# Patient Record
Sex: Female | Born: 2000 | Race: White | Hispanic: No | Marital: Single | Smoking: Never smoker
Health system: Southern US, Community
[De-identification: ages and names within clinical notes are randomized; demographics above are authoritative.]

## PROBLEM LIST (undated history)

## (undated) DIAGNOSIS — Z789 Other specified health status: Secondary | ICD-10-CM

## (undated) HISTORY — PX: FOOT SURGERY: SHX648

---
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
## 2024-08-22 ENCOUNTER — Encounter (HOSPITAL_COMMUNITY): Payer: Self-pay | Admitting: Obstetrics and Gynecology

## 2024-08-22 ENCOUNTER — Inpatient Hospital Stay (HOSPITAL_COMMUNITY)
Admission: AD | Admit: 2024-08-22 | Discharge: 2024-08-22 | Discharge status: Against Medical Advice | Attending: Obstetrics and Gynecology | Admitting: Obstetrics and Gynecology

## 2024-08-22 DIAGNOSIS — O99322 Drug use complicating pregnancy, second trimester: Secondary | ICD-10-CM | POA: Insufficient documentation

## 2024-08-22 DIAGNOSIS — F419 Anxiety disorder, unspecified: Secondary | ICD-10-CM

## 2024-08-22 DIAGNOSIS — O99280 Endocrine, nutritional and metabolic diseases complicating pregnancy, unspecified trimester: Secondary | ICD-10-CM

## 2024-08-22 DIAGNOSIS — E860 Dehydration: Secondary | ICD-10-CM | POA: Diagnosis not present

## 2024-08-22 DIAGNOSIS — F1290 Cannabis use, unspecified, uncomplicated: Secondary | ICD-10-CM | POA: Insufficient documentation

## 2024-08-22 DIAGNOSIS — O219 Vomiting of pregnancy, unspecified: Secondary | ICD-10-CM | POA: Diagnosis not present

## 2024-08-22 DIAGNOSIS — O99342 Other mental disorders complicating pregnancy, second trimester: Secondary | ICD-10-CM | POA: Insufficient documentation

## 2024-08-22 DIAGNOSIS — O209 Hemorrhage in early pregnancy, unspecified: Secondary | ICD-10-CM | POA: Diagnosis present

## 2024-08-22 DIAGNOSIS — O99282 Endocrine, nutritional and metabolic diseases complicating pregnancy, second trimester: Secondary | ICD-10-CM | POA: Diagnosis not present

## 2024-08-22 DIAGNOSIS — Z3A14 14 weeks gestation of pregnancy: Secondary | ICD-10-CM | POA: Diagnosis not present

## 2024-08-22 HISTORY — DX: Other specified health status: Z78.9

## 2024-08-22 LAB — URINALYSIS, ROUTINE W REFLEX MICROSCOPIC
Bilirubin Urine: NEGATIVE
Glucose, UA: NEGATIVE mg/dL
Ketones, ur: 80 mg/dL — AB
Leukocytes,Ua: NEGATIVE
Nitrite: NEGATIVE
Protein, ur: 100 mg/dL — AB
Specific Gravity, Urine: 1.028 (ref 1.005–1.030)
Squamous Epithelial / HPF: >50 /HPF (ref 0–5)
pH: 5 (ref 5.0–8.0)

## 2024-08-22 LAB — CBC
HCT: 43.4 % (ref 36.0–46.0)
Hemoglobin: 14.9 g/dL (ref 12.0–15.0)
MCH: 29.8 pg (ref 26.0–34.0)
MCHC: 34.3 g/dL (ref 30.0–36.0)
MCV: 86.8 fL (ref 80.0–100.0)
Platelets: 288 K/uL (ref 150–400)
RBC: 5 MIL/uL (ref 3.87–5.11)
RDW: 12.6 % (ref 11.5–15.5)
WBC: 16.8 K/uL — ABNORMAL HIGH (ref 4.0–10.5)
nRBC: 0 % (ref 0.0–0.2)

## 2024-08-22 LAB — COMPREHENSIVE METABOLIC PANEL WITH GFR
ALT: 20 U/L (ref 0–44)
AST: 17 U/L (ref 15–41)
Albumin: 4 g/dL (ref 3.5–5.0)
Alkaline Phosphatase: 46 U/L (ref 38–126)
Anion gap: 13 (ref 5–15)
BUN: 6 mg/dL (ref 6–20)
CO2: 22 mmol/L (ref 22–32)
Calcium: 9.9 mg/dL (ref 8.9–10.3)
Chloride: 104 mmol/L (ref 98–111)
Creatinine, Ser: 0.53 mg/dL (ref 0.44–1.00)
GFR, Estimated: >60 mL/min (ref >60)
Glucose, Bld: 94 mg/dL (ref 70–99)
Potassium: 3.4 mmol/L — ABNORMAL LOW (ref 3.5–5.1)
Sodium: 139 mmol/L (ref 135–145)
Total Bilirubin: 0.6 mg/dL (ref 0.0–1.2)
Total Protein: 7.6 g/dL (ref 6.5–8.1)

## 2024-08-22 LAB — WET PREP, GENITAL
Sperm: NONE SEEN
Trich, Wet Prep: NONE SEEN
WBC, Wet Prep HPF POC: <10 (ref <10)
Yeast Wet Prep HPF POC: NONE SEEN

## 2024-08-22 MED ORDER — LACTATED RINGERS IV BOLUS
1000.0000 mL | Freq: Once | INTRAVENOUS | Status: AC
  Administered 2024-08-22: 1000 mL via INTRAVENOUS

## 2024-08-22 MED ORDER — METOCLOPRAMIDE HCL 5 MG/ML IJ SOLN
10.0000 mg | Freq: Once | INTRAMUSCULAR | Status: AC
  Administered 2024-08-22: 10 mg via INTRAVENOUS
  Filled 2024-08-22: qty 2

## 2024-08-22 MED ORDER — SCOPOLAMINE 1 MG/3DAYS TD PT72
1.0000 | MEDICATED_PATCH | TRANSDERMAL | Status: DC
  Administered 2024-08-22: 1 mg via TRANSDERMAL
  Filled 2024-08-22: qty 1

## 2024-08-22 NOTE — MAU Provider Note (Incomplete)
 Chief Complaint:  Emesis and Vaginal Bleeding   HPI    Rachel Pham is a 23 y.o. G1P0 at Unknown who presents to maternity admissions reporting ***.   Pregnancy Course: ***  Past Medical History:  Diagnosis Date  . Medical history non-contributory    OB History  Gravida Para Term Preterm AB Living  1       SAB IAB Ectopic Multiple Live Births          # Outcome Date GA Lbr Len/2nd Weight Sex Type Anes PTL Lv  1 Current            Past Surgical History:  Procedure Laterality Date  . FOOT SURGERY     glass removal from foot   History reviewed. No pertinent family history. Social History   Tobacco Use  . Smoking status: Never  Substance Use Topics  . Alcohol use: Not Currently  . Drug use: Yes    Types: Marijuana   Allergies  Allergen Reactions  . Penicillins Rash   No medications prior to admission.    I have reviewed patient's Past Medical Hx, Surgical Hx, Family Hx, Social Hx, medications and allergies.   ROS  Pertinent items noted in HPI and remainder of comprehensive ROS otherwise negative.   PHYSICAL EXAM  Patient Vitals for the past 24 hrs:  BP Temp Temp src Pulse Resp Height Weight  08/22/24 2124 133/82 98.2 F (36.8 C) Oral 80 16 5' 4.5 (1.638 m) 86.3 kg    Constitutional: Well-developed, well-nourished female in no acute distress.  Cardiovascular: normal rate & rhythm, warm and well-perfused Respiratory: normal effort, no problems with respiration noted GI: Abd soft, non-tender, gravid MS: Extremities nontender, no edema, normal ROM Neurologic: Alert and oriented x 4.  GU: no CVA tenderness Pelvic: NEFG, physiologic discharge, no blood, cervix clean.      Fetal Tracing: 160 via Doppler   Labs: Results for orders placed or performed during the hospital encounter of 08/22/24 (from the past 24 hours)  Urinalysis, Routine w reflex microscopic -Urine, Clean Catch     Status: Abnormal   Collection Time: 08/22/24  9:46 PM  Result Value Ref  Range   Color, Urine AMBER (A) YELLOW   APPearance CLOUDY (A) CLEAR   Specific Gravity, Urine 1.028 1.005 - 1.030   pH 5.0 5.0 - 8.0   Glucose, UA NEGATIVE NEGATIVE mg/dL   Hgb urine dipstick LARGE (A) NEGATIVE   Bilirubin Urine NEGATIVE NEGATIVE   Ketones, ur 80 (A) NEGATIVE mg/dL   Protein, ur 899 (A) NEGATIVE mg/dL   Nitrite NEGATIVE NEGATIVE   Leukocytes,Ua NEGATIVE NEGATIVE   RBC / HPF 0-5 0 - 5 RBC/hpf   WBC, UA 0-5 0 - 5 WBC/hpf   Bacteria, UA FEW (A) NONE SEEN   Squamous Epithelial / HPF >50 0 - 5 /HPF   Mucus PRESENT   CBC     Status: Abnormal   Collection Time: 08/22/24 11:00 PM  Result Value Ref Range   WBC 16.8 (H) 4.0 - 10.5 K/uL   RBC 5.00 3.87 - 5.11 MIL/uL   Hemoglobin 14.9 12.0 - 15.0 g/dL   HCT 56.5 63.9 - 53.9 %   MCV 86.8 80.0 - 100.0 fL   MCH 29.8 26.0 - 34.0 pg   MCHC 34.3 30.0 - 36.0 g/dL   RDW 87.3 88.4 - 84.4 %   Platelets 288 150 - 400 K/uL   nRBC 0.0 0.0 - 0.2 %  Comprehensive metabolic panel  Status: Abnormal   Collection Time: 08/22/24 11:00 PM  Result Value Ref Range   Sodium 139 135 - 145 mmol/L   Potassium 3.4 (L) 3.5 - 5.1 mmol/L   Chloride 104 98 - 111 mmol/L   CO2 22 22 - 32 mmol/L   Glucose, Bld 94 70 - 99 mg/dL   BUN 6 6 - 20 mg/dL   Creatinine, Ser 9.46 0.44 - 1.00 mg/dL   Calcium 9.9 8.9 - 89.6 mg/dL   Total Protein 7.6 6.5 - 8.1 g/dL   Albumin 4.0 3.5 - 5.0 g/dL   AST 17 15 - 41 U/L   ALT 20 0 - 44 U/L   Alkaline Phosphatase 46 38 - 126 U/L   Total Bilirubin 0.6 0.0 - 1.2 mg/dL   GFR, Estimated >39 >39 mL/min   Anion gap 13 5 - 15  Wet prep, genital     Status: Abnormal   Collection Time: 08/22/24 11:00 PM  Result Value Ref Range   Yeast Wet Prep HPF POC NONE SEEN NONE SEEN   Trich, Wet Prep NONE SEEN NONE SEEN   Clue Cells Wet Prep HPF POC PRESENT (A) NONE SEEN   WBC, Wet Prep HPF POC <10 <10   Sperm NONE SEEN     Imaging:  No results found.  MDM & MAU COURSE  MDM:  HIGH  MAU Course: Orders Placed This  Encounter  Procedures  . Wet prep, genital  . Urinalysis, Routine w reflex microscopic -Urine, Clean Catch  . CBC  . Comprehensive metabolic panel   Meds ordered this encounter  Medications  . lactated ringers bolus 1,000 mL  . metoCLOPramide (REGLAN) injection 10 mg  . scopolamine (TRANSDERM-SCOP) 1 MG/3DAYS 1 mg    ASSESSMENT  No diagnosis found.  PLAN  Informed by RN that patient wanted her IV out and she was leaving AMA prior to results @ 2323 and she signed AMA form prior to ambulating out with significant other. ------------------------------------------------------------------------------- Olam Dalton, MSN, [facility], Center for [facility]    This chart was dictated using voice recognition software, Dragon. Despite the best efforts of this provider to proofread and correct errors, errors may still occur which can change documentation meaning.

## 2024-08-22 NOTE — MAU Note (Addendum)
 Pt says a [facility] in Aug - positive UPT .  Had labs at [facility] in [place]  in Sept . Had U/S- IUP in Sept. Went to [facility] - at 1pm-VB  - U/S- declined pelvic exam. Told her FHR- was good - but her cervix was open.  Says has been bleeding heavy since 4 pm.  In Triage- VB on pad light amt - light pink .  Started vomiting at 4 pm - has Zofran at home - has been taking Zofran every morning - but doesn't help . Says now 14 weeks by U/S .

## 2024-08-22 NOTE — MAU Provider Note (Signed)
 Chief Complaint:  Emesis and Vaginal Bleeding   HPI    Rachel Pham is a 23 y.o. G1P0 at Unknown who presents to maternity admissions reporting that she had went to [facility] today at 1 PM for complaints of vaginal bleeding and she had an ultrasound but declined a pelvic exam.  Patient states she has had some vaginal bleeding since 4 PM today in triage vaginal bleeding was light  on a pad. The patient pointed to light pink on the vaginal bleeding chart in triage.   When I spoke with the patient she stated her main complaint was vomiting since 4 PM and had Zofran but had declined taking it anymore because it was not working.  She reports she is about [redacted] weeks pregnant.  Pregnancy Course: [facility] ( Available PNR Reviewed- Last seen on 08/12/2024)  Past Medical History:  Diagnosis Date   Medical history non-contributory    OB History  Gravida Para Term Preterm AB Living  1       SAB IAB Ectopic Multiple Live Births          # Outcome Date GA Lbr Len/2nd Weight Sex Type Anes PTL Lv  1 Current            Past Surgical History:  Procedure Laterality Date   FOOT SURGERY     glass removal from foot   History reviewed. No pertinent family history. Social History   Tobacco Use   Smoking status: Never  Substance Use Topics   Alcohol use: Not Currently   Drug use: Yes    Types: Marijuana   Allergies  Allergen Reactions   Penicillins Rash   No medications prior to admission.    I have reviewed patient's Past Medical Hx, Surgical Hx, Family Hx, Social Hx, medications and allergies.   ROS  Pertinent items noted in HPI and remainder of comprehensive ROS otherwise negative.   PHYSICAL EXAM  Patient Vitals for the past 24 hrs:  BP Temp Temp src Pulse Resp Height Weight  08/22/24 2124 133/82 98.2 F (36.8 C) Oral 80 16 5' 4.5 (1.638 m) 86.3 kg    Constitutional: Well-developed, well-nourished female who appears very anxious and upset and tearful  Cardiovascular:  normal rate & rhythm, warm and well-perfused Respiratory: normal effort, no problems with respiration noted GI: Abd soft, non-tender, no guarding, vomiting in room was noticed via emesis basins Pelvic: Patient Declined Exam     Fetal Tracing: 160 via Doppler   Labs: Results for orders placed or performed during the hospital encounter of 08/22/24 (from the past 24 hours)  Urinalysis, Routine w reflex microscopic -Urine, Clean Catch     Status: Abnormal   Collection Time: 08/22/24  9:46 PM  Result Value Ref Range   Color, Urine AMBER (A) YELLOW   APPearance CLOUDY (A) CLEAR   Specific Gravity, Urine 1.028 1.005 - 1.030   pH 5.0 5.0 - 8.0   Glucose, UA NEGATIVE NEGATIVE mg/dL   Hgb urine dipstick LARGE (A) NEGATIVE   Bilirubin Urine NEGATIVE NEGATIVE   Ketones, ur 80 (A) NEGATIVE mg/dL   Protein, ur 899 (A) NEGATIVE mg/dL   Nitrite NEGATIVE NEGATIVE   Leukocytes,Ua NEGATIVE NEGATIVE   RBC / HPF 0-5 0 - 5 RBC/hpf   WBC, UA 0-5 0 - 5 WBC/hpf   Bacteria, UA FEW (A) NONE SEEN   Squamous Epithelial / HPF >50 0 - 5 /HPF   Mucus PRESENT   CBC     Status: Abnormal  Collection Time: 08/22/24 11:00 PM  Result Value Ref Range   WBC 16.8 (H) 4.0 - 10.5 K/uL   RBC 5.00 3.87 - 5.11 MIL/uL   Hemoglobin 14.9 12.0 - 15.0 g/dL   HCT 56.5 63.9 - 53.9 %   MCV 86.8 80.0 - 100.0 fL   MCH 29.8 26.0 - 34.0 pg   MCHC 34.3 30.0 - 36.0 g/dL   RDW 87.3 88.4 - 84.4 %   Platelets 288 150 - 400 K/uL   nRBC 0.0 0.0 - 0.2 %  Comprehensive metabolic panel     Status: Abnormal   Collection Time: 08/22/24 11:00 PM  Result Value Ref Range   Sodium 139 135 - 145 mmol/L   Potassium 3.4 (L) 3.5 - 5.1 mmol/L   Chloride 104 98 - 111 mmol/L   CO2 22 22 - 32 mmol/L   Glucose, Bld 94 70 - 99 mg/dL   BUN 6 6 - 20 mg/dL   Creatinine, Ser 9.46 0.44 - 1.00 mg/dL   Calcium 9.9 8.9 - 89.6 mg/dL   Total Protein 7.6 6.5 - 8.1 g/dL   Albumin 4.0 3.5 - 5.0 g/dL   AST 17 15 - 41 U/L   ALT 20 0 - 44 U/L   Alkaline  Phosphatase 46 38 - 126 U/L   Total Bilirubin 0.6 0.0 - 1.2 mg/dL   GFR, Estimated >39 >39 mL/min   Anion gap 13 5 - 15  Wet prep, genital     Status: Abnormal   Collection Time: 08/22/24 11:00 PM  Result Value Ref Range   Yeast Wet Prep HPF POC NONE SEEN NONE SEEN   Trich, Wet Prep NONE SEEN NONE SEEN   Clue Cells Wet Prep HPF POC PRESENT (A) NONE SEEN   WBC, Wet Prep HPF POC <10 <10   Sperm NONE SEEN     Imaging:  No results found.  MDM & MAU COURSE  MDM:  HIGH  N/V in pregnancy  CBC: WBC at 16.8  CMP:  Mild hypokalemia at 3.4  UA: Ketones and proteinuria c/w  with dehydration IVF-LR bolus ordered Antiemetics: IV Reglan 10 mg, scopolamine patch PO Challenge ( Patient left AMA)    Informed by RN that patient wanted her IV out and she was leaving AMA prior to results @ 2323 and she signed AMA form prior to ambulating out with significant other.   Differential diagnosis with nausea/vomiting includes but is not limited to: nausea due to pregnancy, hyperemesis gravidarum, viral infection, gastroenteritis, cholecystitis, pancreatitis, ACS, food poisoning, diabetic ketoacidosis, drug ingestion, gastroparesis, alcohol/opiate withdrawal   MAU Course: Orders Placed This Encounter  Procedures   Wet prep, genital   Urinalysis, Routine w reflex microscopic -Urine, Clean Catch   CBC   Comprehensive metabolic panel   Meds ordered this encounter  Medications   lactated ringers bolus 1,000 mL   metoCLOPramide (REGLAN) injection 10 mg   scopolamine (TRANSDERM-SCOP) 1 MG/3DAYS 1 mg    ASSESSMENT   Nausea and vomiting during pregnancy  [redacted] weeks gestation of pregnancy  Dehydration during pregnancy  Anxiety   PLAN  Informed by RN that patient wanted her IV out and she was leaving AMA prior to results @ 2323 and she signed AMA form prior to ambulating out with significant other. ------------------------------------------------------------------------------- Olam Dalton,  MSN, [facility], Center for [facility]    This chart was dictated using voice recognition software, Dragon. Despite the best efforts of this provider to proofread and correct errors,  errors may still occur which can change documentation meaning.

## 2024-08-22 NOTE — MAU Note (Signed)
 Pt called out and asked to speak to a nurse. When this RN went in to check on pt, pt stated, I do not want to wait for my lab results, can you all just discharge me? RN informed pt I would find the provider and let her know of her wishes to be d/c'd and have her come in and talk to pt. Pt stated she did not want to wait to for provider and was about to just walk out. RN informed pt she was free to leave if she would like and would be required to sign an AMA form. Pt stated that was fine, I will will sign the form. Pt verbalized understanding of leaving AMA and signed form before ambulating out of MAU with significant other.

## 2024-08-25 ENCOUNTER — Encounter (HOSPITAL_COMMUNITY): Payer: Self-pay | Admitting: Obstetrics & Gynecology

## 2024-08-25 ENCOUNTER — Other Ambulatory Visit: Payer: Self-pay

## 2024-08-25 ENCOUNTER — Inpatient Hospital Stay (HOSPITAL_COMMUNITY)
Admission: AD | Admit: 2024-08-25 | Discharge: 2024-08-25 | Disposition: A | Discharge status: Home / Self Care | Attending: Obstetrics & Gynecology | Admitting: Obstetrics & Gynecology

## 2024-08-25 DIAGNOSIS — Z3402 Encounter for supervision of normal first pregnancy, second trimester: Secondary | ICD-10-CM | POA: Diagnosis not present

## 2024-08-25 DIAGNOSIS — Z711 Person with feared health complaint in whom no diagnosis is made: Secondary | ICD-10-CM | POA: Diagnosis not present

## 2024-08-25 DIAGNOSIS — Z3A15 15 weeks gestation of pregnancy: Secondary | ICD-10-CM | POA: Diagnosis not present

## 2024-08-25 DIAGNOSIS — O209 Hemorrhage in early pregnancy, unspecified: Secondary | ICD-10-CM | POA: Diagnosis present

## 2024-08-25 LAB — GC/CHLAMYDIA PROBE AMP (~~LOC~~) NOT AT ARMC
Chlamydia: NEGATIVE
Comment: NEGATIVE
Comment: NORMAL
Neisseria Gonorrhea: NEGATIVE

## 2024-08-25 NOTE — MAU Provider Note (Signed)
 Event Date/Time   First Provider Initiated Contact with Patient 08/25/24 1513      S Rachel Pham is a 23 y.o. G1P0 patient who presents to MAU today reporting she was seen in MAU on Friday for bleeding and was d/c'd home while still bleeding. She reports the bleeding stopped and now she is in fear the baby died. She wanted to hear the baby's heartbeat to make sure he is ok. She was instructed to go to her OB provider to hear the baby's heartbeat for reassurance, but the earliest appt they had was tomorrow. She states, That was just too long for me. I couldn't wait until tomorrow to see if he was ok. So this was my next option. She receives [facility]; next appt is 08/26/2024.    O BP 123/82 (BP Location: Right Arm)   Pulse 81   Temp 98.9 F (37.2 C) (Oral)   Resp 18   Ht 5' 4.5 (1.638 m)   Wt 87.8 kg   SpO2 98%   BMI 32.70 kg/m   Physical Exam Vitals and nursing note reviewed.  Constitutional:      Appearance: Normal appearance. She is normal weight.  Pulmonary:     Effort: Pulmonary effort is normal.  Neurological:     Mental Status: She is alert.  Psychiatric:        Attention and Perception: Attention and perception normal.        Mood and Affect: Mood is anxious.        Speech: Speech normal.        Behavior: Behavior normal. Behavior is cooperative.        Thought Content: Thought content normal.        Cognition and Memory: Cognition and memory normal.        Judgment: Judgment normal.    FHTs by doppler: 167 bpm  A Medical screening exam complete 1. Physically well but worried (Primary)  2. [redacted] weeks gestation of pregnancy    P Discharge from MAU in stable condition Advised to keep scheduled appt with her OB provider on 08/26/2024 Warning signs for worsening condition that would warrant emergency follow-up discussed Patient may return to MAU as needed for pregnancy related issues  Letha Renshaw, CNM 08/25/2024 3:13 PM

## 2024-08-25 NOTE — MAU Note (Signed)
 Rachel Pham is a 23 y.o. at [redacted]w[redacted]d here in MAU reporting: over the past weekend had bleeding and went to [facility], they checked a FHR and did an US  and everything was fine. Came here after that because the bleeding was still heavy and when she went back to [place] they told her to wait in the waiting room. Was evaluated here in MAU on Friday and she states nothing was wrong so they sent her home. Here today because the bleeding has stopped and so she is worried something might be wrong with the baby.    Pain score: 0/10 Vitals:   08/25/24 1506  BP: 123/82  Pulse: 81  Resp: 18  Temp: 98.9 F (37.2 C)  SpO2: 98%     FHT: 167
